# Patient Record
Sex: Male | Born: 1965 | Race: White | Hispanic: No | Marital: Married
Health system: Southern US, Community
[De-identification: ages and names within clinical notes are randomized; demographics above are authoritative.]

---

## 2008-08-07 ENCOUNTER — Emergency Department (HOSPITAL_COMMUNITY): Admission: EM | Admit: 2008-08-07 | Discharge: 2008-08-07 | Payer: Self-pay | Admitting: Emergency Medicine

## 2009-08-05 ENCOUNTER — Emergency Department (HOSPITAL_BASED_OUTPATIENT_CLINIC_OR_DEPARTMENT_OTHER): Admission: EM | Admit: 2009-08-05 | Discharge: 2009-08-05 | Payer: Self-pay | Admitting: Emergency Medicine

## 2009-08-05 ENCOUNTER — Ambulatory Visit: Payer: Self-pay | Admitting: Radiology

## 2020-10-26 ENCOUNTER — Emergency Department (HOSPITAL_BASED_OUTPATIENT_CLINIC_OR_DEPARTMENT_OTHER)
Admission: EM | Admit: 2020-10-26 | Discharge: 2020-10-26 | Disposition: A | Discharge status: Home / Self Care | Payer: BLUE CROSS/BLUE SHIELD | Attending: Emergency Medicine | Admitting: Emergency Medicine

## 2020-10-26 ENCOUNTER — Emergency Department (HOSPITAL_BASED_OUTPATIENT_CLINIC_OR_DEPARTMENT_OTHER): Payer: BLUE CROSS/BLUE SHIELD

## 2020-10-26 ENCOUNTER — Other Ambulatory Visit: Payer: Self-pay

## 2020-10-26 DIAGNOSIS — S9032XA Contusion of left foot, initial encounter: Secondary | ICD-10-CM | POA: Insufficient documentation

## 2020-10-26 DIAGNOSIS — S99922A Unspecified injury of left foot, initial encounter: Secondary | ICD-10-CM | POA: Diagnosis present

## 2020-10-26 DIAGNOSIS — W208XXA Other cause of strike by thrown, projected or falling object, initial encounter: Secondary | ICD-10-CM | POA: Insufficient documentation

## 2020-10-26 MED ORDER — HYDROCODONE-ACETAMINOPHEN 5-325 MG PO TABS: 2.0000 | ORAL_TABLET | Freq: Once | ORAL | Status: DC

## 2020-10-26 MED ORDER — HYDROCODONE-ACETAMINOPHEN 5-325 MG PO TABS
1.0000 | ORAL_TABLET | Freq: Once | ORAL | Status: AC
  Administered 2020-10-26: 1 via ORAL
  Filled 2020-10-26: qty 1

## 2020-10-26 MED ORDER — HYDROCODONE-ACETAMINOPHEN 5-325 MG PO TABS: 1.0000 | ORAL_TABLET | Freq: Four times a day (QID) | ORAL | 0 refills | Status: AC | PRN

## 2020-10-26 NOTE — ED Notes (Signed)
Abrasions to left lower leg cleaned and dressed with bacitracin and bandages

## 2020-10-26 NOTE — ED Provider Notes (Signed)
[facility] EMERGENCY DEPARTMENT Provider Note   CSN: [id number] Arrival date & time: 10/26/20  4967     History Chief Complaint  Patient presents with   Foot Pain    Nicholas Figueroa is a 55 y.o. male.  With no significant past medical history who presents to the emergency department for left foot injury.  States that he was moving a refrigerator with a furniture dolly when it fell backward striking his left foot.  States that the corner of the refrigerator hit the foot and knocked him backwards.  Denies hitting his head or loss of consciousness.  States that he has been weightbearing since with pain.  Denies numbness or tingling.  Denies any other injuries.  Not anticoagulated.   Foot Pain      No past medical history on file.  There are no problems to display for this patient.      No family history on file.     Home Medications Prior to Admission medications   Not on File    Allergies    Patient has no allergy information on record.  Review of Systems   Review of Systems  Musculoskeletal:  Positive for gait problem.  Skin:  Positive for wound.  All other systems reviewed and are negative.  Physical Exam Updated Vital Signs There were no vitals taken for this visit.  Physical Exam Vitals and nursing note reviewed.  Constitutional:      General: He is not in acute distress.    Appearance: Normal appearance.  HENT:     Head: Normocephalic and atraumatic.  Eyes:     General: No scleral icterus. Pulmonary:     Effort: Pulmonary effort is normal. No respiratory distress.  Musculoskeletal:     Right foot: Normal.     Left foot: Decreased range of motion. Normal capillary refill. Swelling, deformity, tenderness and bony tenderness present. No foot drop. Normal pulse.     Comments: Pulses equal bilaterally, sensation intact bilaterally, mild decreased range of motion of the left foot.  Obvious swelling/deformity to dorsal aspect of left foot.   Skin:    General: Skin is warm and dry.     Capillary Refill: Capillary refill takes less than 2 seconds.     Findings: No rash.  Neurological:     General: No focal deficit present.     Mental Status: He is alert and oriented to person, place, and time.     Sensory: No sensory deficit.  Psychiatric:        Mood and Affect: Mood normal.        Behavior: Behavior normal.        Thought Content: Thought content normal.        Judgment: Judgment normal.    ED Results / Procedures / Treatments   Labs (all labs ordered are listed, but only abnormal results are displayed) Labs Reviewed - No data to display  EKG None  Radiology DG Foot Complete Left  Result Date: 10/26/2020 CLINICAL DATA:  Patient dropped old Fridge on left foot this morning. Abrasion to left shin. EXAM: LEFT FOOT - COMPLETE 3+ VIEW COMPARISON:  None. FINDINGS: There is no evidence of fracture or dislocation. There is no evidence of arthropathy or other focal bone abnormality. Second through fifth hammertoe deformities small plantar heel spur. Soft tissues are unremarkable. IMPRESSION: 1. No acute findings. 2. Hammertoe deformities. Electronically Signed   By: Signa Kell M.D.   On: 10/26/2020 10:51    Procedures  Procedures   Medications Ordered in ED Medications  HYDROcodone-acetaminophen (NORCO/VICODIN) 5-325 MG per tablet 1 tablet (has no administration in time range)    ED Course  I have reviewed the triage vital signs and the nursing notes.  Pertinent labs & imaging results that were available during my care of the patient were reviewed by me and considered in my medical decision making (see chart for details).    MDM Rules/Calculators/A&P 55 year old male who presents emergency department with left foot pain.  X-ray left foot complete without fracture or dislocations. He has obvious contusion swelling to the left foot.  Provided with 1 dose of Norco here in the emergency department.  States that he  has someone to drive him home.  Discussed with him rest, ice, elevation over the next few days.  Instructed him to take ibuprofen every 6 hours for pain relief and he may take Norco 1 tablet as needed for severe pain.  I have provided him with orthopedic follow-up as needed if he feels it is not getting better.  Offered him crutches to use over the next few days but states he would just use a cane at home if he needs to.  Vital signs stable he is safe for discharge. Final Clinical Impression(s) / ED Diagnoses Final diagnoses:  Contusion of left foot, initial encounter    Rx / DC Orders ED Discharge Orders          Ordered    HYDROcodone-acetaminophen (NORCO/VICODIN) 5-325 MG tablet  Every 6 hours PRN        10/26/20 1114             Cristopher Peru, PA-C 10/26/20 1116    Tanda Rockers A, DO 10/26/20 1759

## 2020-10-26 NOTE — ED Triage Notes (Signed)
Reports he dropped an old fridge on the left foot this  morning.  Also has an abrasion to the left shin.

## 2021-03-20 DIAGNOSIS — R52 Pain, unspecified: Secondary | ICD-10-CM | POA: Diagnosis not present

## 2021-04-01 DIAGNOSIS — R52 Pain, unspecified: Secondary | ICD-10-CM | POA: Diagnosis not present

## 2021-04-04 DIAGNOSIS — M79672 Pain in left foot: Secondary | ICD-10-CM | POA: Diagnosis not present

## 2021-08-14 DIAGNOSIS — M25561 Pain in right knee: Secondary | ICD-10-CM | POA: Diagnosis not present

## 2021-08-14 DIAGNOSIS — M1711 Unilateral primary osteoarthritis, right knee: Secondary | ICD-10-CM | POA: Diagnosis not present

## 2021-12-08 DIAGNOSIS — Z0000 Encounter for general adult medical examination without abnormal findings: Secondary | ICD-10-CM | POA: Diagnosis not present

## 2021-12-08 DIAGNOSIS — E291 Testicular hypofunction: Secondary | ICD-10-CM | POA: Diagnosis not present

## 2021-12-08 DIAGNOSIS — Z125 Encounter for screening for malignant neoplasm of prostate: Secondary | ICD-10-CM | POA: Diagnosis not present

## 2021-12-08 DIAGNOSIS — E039 Hypothyroidism, unspecified: Secondary | ICD-10-CM | POA: Diagnosis not present

## 2021-12-11 DIAGNOSIS — Z85828 Personal history of other malignant neoplasm of skin: Secondary | ICD-10-CM | POA: Diagnosis not present

## 2021-12-11 DIAGNOSIS — E782 Mixed hyperlipidemia: Secondary | ICD-10-CM | POA: Diagnosis not present

## 2021-12-11 DIAGNOSIS — Z0000 Encounter for general adult medical examination without abnormal findings: Secondary | ICD-10-CM | POA: Diagnosis not present

## 2021-12-11 DIAGNOSIS — E039 Hypothyroidism, unspecified: Secondary | ICD-10-CM | POA: Diagnosis not present

## 2021-12-11 DIAGNOSIS — N529 Male erectile dysfunction, unspecified: Secondary | ICD-10-CM | POA: Diagnosis not present

## 2021-12-11 DIAGNOSIS — L4050 Arthropathic psoriasis, unspecified: Secondary | ICD-10-CM | POA: Diagnosis not present

## 2021-12-11 DIAGNOSIS — Z23 Encounter for immunization: Secondary | ICD-10-CM | POA: Diagnosis not present

## 2021-12-11 DIAGNOSIS — R69 Illness, unspecified: Secondary | ICD-10-CM | POA: Diagnosis not present

## 2021-12-21 DIAGNOSIS — Z87891 Personal history of nicotine dependence: Secondary | ICD-10-CM | POA: Diagnosis not present

## 2023-09-06 IMAGING — CR DG FOOT COMPLETE 3+V*L*
3 series · 3 of 3 positions shown · non-contrast
Comparison: None.

CLINICAL DATA: Patient dropped old Fridge on left foot this
morning. Abrasion to left shin.

EXAM:
LEFT FOOT - COMPLETE 3+ VIEW

[t foot ap left]
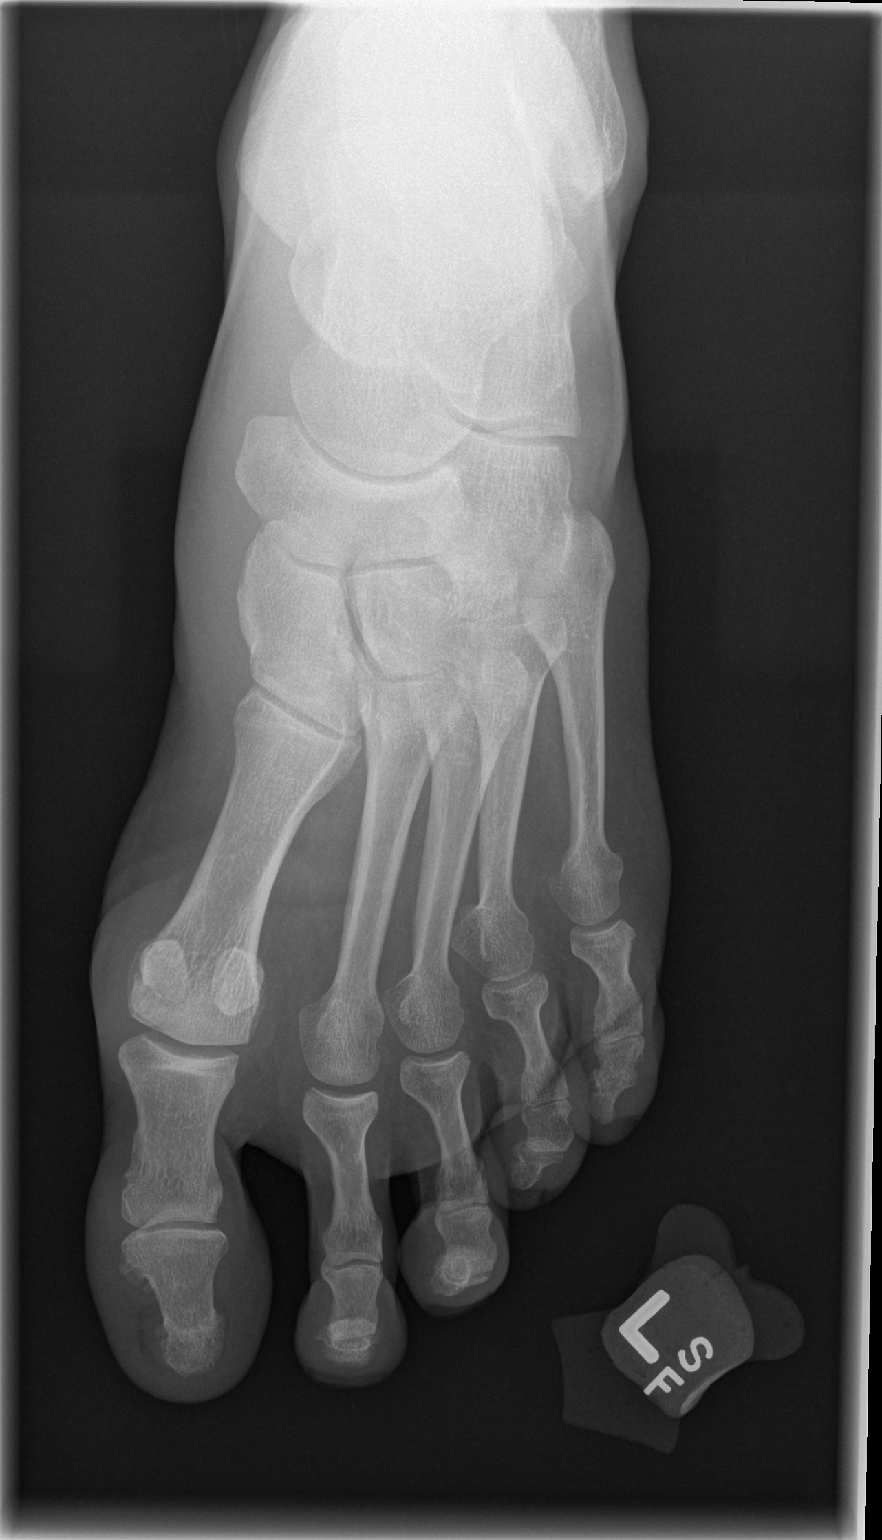

[t foot oblique left]
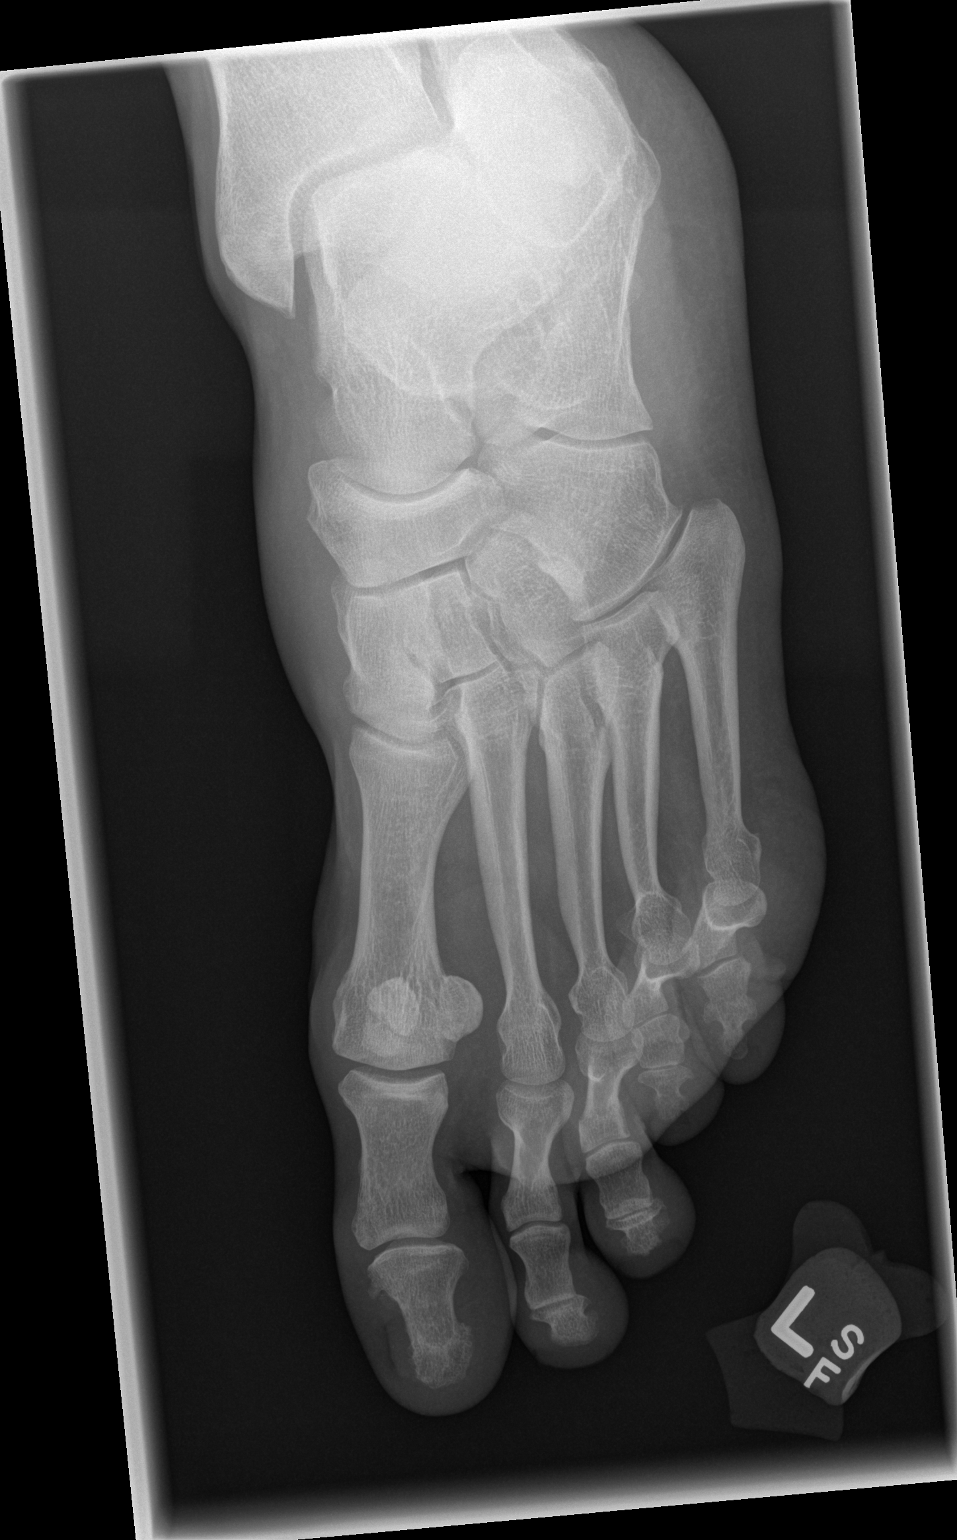

[t foot lat left]
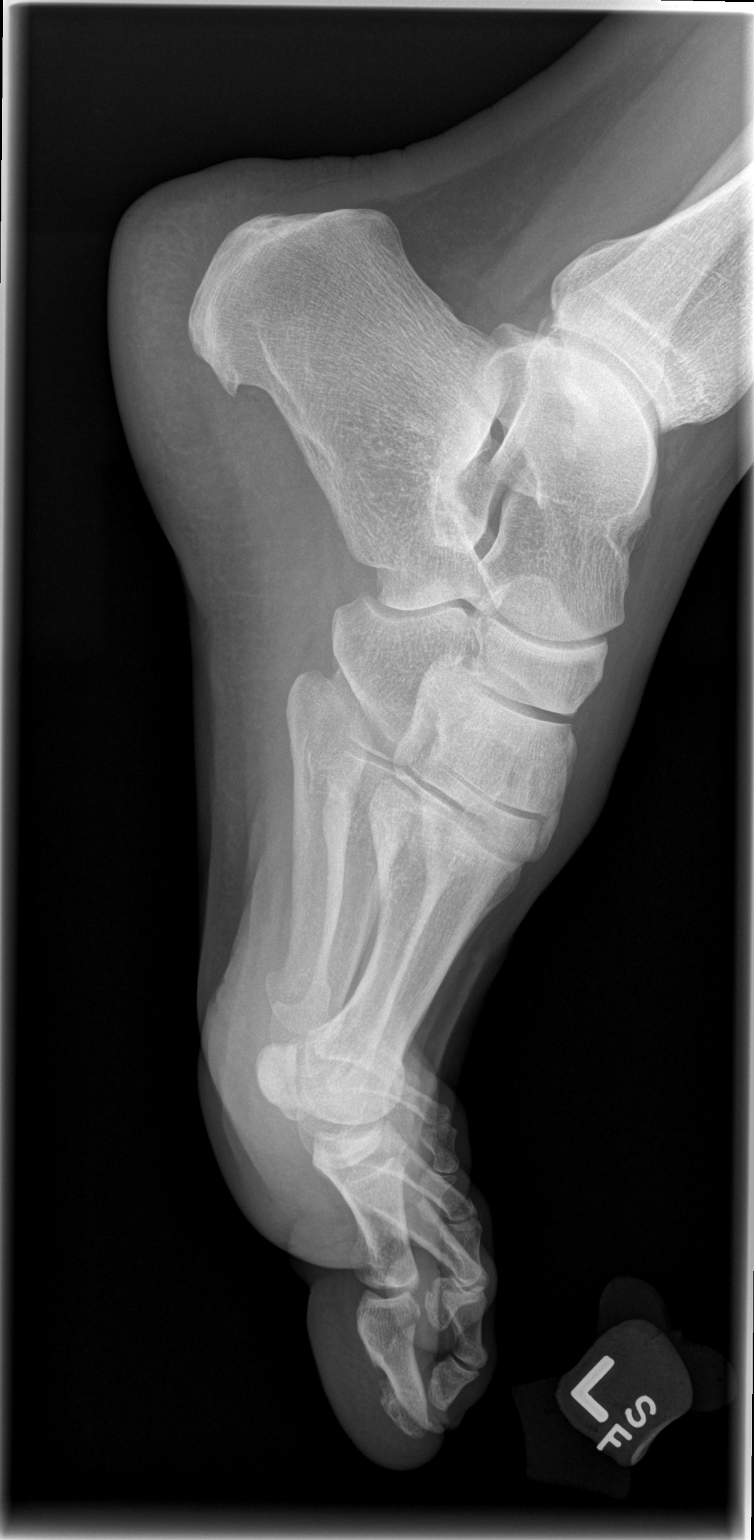

[3 of 3 positions shown; findings below may reference images not displayed]

FINDINGS: There is no evidence of fracture or dislocation. There is no
evidence of arthropathy or other focal bone abnormality. Second
through fifth hammertoe deformities small plantar heel spur. Soft
tissues are unremarkable.
IMPRESSION: 1. No acute findings.
2. Hammertoe deformities.
